# Patient Record
Sex: Male | Born: 1965 | Race: Black or African American | Hispanic: No | Marital: Single | Smoking: Current every day smoker
Health system: Southern US, Community
[De-identification: ages and names within clinical notes are randomized; demographics above are authoritative.]

## PROBLEM LIST (undated history)

## (undated) DIAGNOSIS — D539 Nutritional anemia, unspecified: Secondary | ICD-10-CM

## (undated) DIAGNOSIS — Z789 Other specified health status: Secondary | ICD-10-CM

## (undated) HISTORY — PX: TOOTH EXTRACTION: SUR596

## (undated) HISTORY — DX: Nutritional anemia, unspecified: D53.9

## (undated) HISTORY — PX: NO PAST SURGERIES: SHX2092

---

## 2010-08-17 ENCOUNTER — Emergency Department (HOSPITAL_COMMUNITY)
Admission: EM | Admit: 2010-08-17 | Discharge: 2010-08-17 | Disposition: A | Discharge status: Home / Self Care | Payer: Self-pay | Attending: Emergency Medicine | Admitting: Emergency Medicine

## 2010-08-17 DIAGNOSIS — K089 Disorder of teeth and supporting structures, unspecified: Secondary | ICD-10-CM | POA: Insufficient documentation

## 2011-09-08 ENCOUNTER — Emergency Department (INDEPENDENT_AMBULATORY_CARE_PROVIDER_SITE_OTHER)
Admission: EM | Admit: 2011-09-08 | Discharge: 2011-09-08 | Disposition: A | Discharge status: Home / Self Care | Payer: Managed Care, Other (non HMO) | Source: Home / Self Care

## 2011-09-08 ENCOUNTER — Encounter (HOSPITAL_COMMUNITY): Payer: Self-pay | Admitting: *Deleted

## 2011-09-08 DIAGNOSIS — K047 Periapical abscess without sinus: Secondary | ICD-10-CM

## 2011-09-08 MED ORDER — HYDROCODONE-ACETAMINOPHEN 5-500 MG PO TABS: 1.0000 | ORAL_TABLET | Freq: Four times a day (QID) | ORAL | Status: AC | PRN

## 2011-09-08 MED ORDER — CLINDAMYCIN HCL 300 MG PO CAPS: 300.0000 mg | ORAL_CAPSULE | Freq: Three times a day (TID) | ORAL | Status: AC

## 2011-09-08 NOTE — ED Notes (Signed)
Reports waking up with right jaw swelling and lower tooth pain.  Denies fevers.  Has not taken any measures to help alleviate sxs.  Does not have dentist.

## 2011-09-08 NOTE — Discharge Instructions (Signed)
Make sure you keep drinking and stay hydrated.   Come back if you start having any nausea or vomiting, fevers or chills. Take the antibiotic three times a day and the vicodin for pain relief up to 3 times a day.    Dental Abscess A dental abscess usually starts from an infected tooth. Antibiotic medicine and pain pills can be helpful, but dental infections require the attention of a dentist. Rinse around the infected area often with salt water (a pinch of salt in 8 oz of warm water). Do not apply heat to the outside of your face. See your dentist or oral surgeon as soon as possible.  SEEK IMMEDIATE MEDICAL CARE IF:  You have increasing, severe pain that is not relieved by medicine.   You or your child has an oral temperature above 102 F (38.9 C), not controlled by medicine.   Your baby is older than 3 months with a rectal temperature of 102 F (38.9 C) or higher.   Your baby is 62 months old or younger with a rectal temperature of 100.4 F (38 C) or higher.   You develop chills, severe headache, difficulty breathing, or trouble swallowing.   You have swelling in the neck or around the eye.  Document Released: 06/16/2005 Document Revised: 06/05/2011 Document Reviewed: 11/25/2006 [facility] Patient Information 2012 [place], [place].

## 2011-09-08 NOTE — ED Provider Notes (Signed)
Darius Flowers is a 46 y.o. male who presents to Urgent Care today for swelling of right lower jaw for past 6 hours. Patient states that he woke up this morning with some soreness to his jaw. He did not have any practice. He went to work and noticed about lunch time that he had started having swelling along lower jaw. No fevers or chills. He has not wanted to eat due to the pain. Is not taking anything for relief of pain. No nausea or vomiting.   PMH reviewed.  ROS as above otherwise neg Medications reviewed. No current facility-administered medications for this encounter.   No current outpatient prescriptions on file.    Exam:  BP 132/69  Pulse 64  Temp(Src) 98.5 F (36.9 C) (Oral)  Resp 16  SpO2 99% Gen: Well NAD HEENT: EOMI,  mucous membranes somewhat dry. Poor dental hygiene. Swelling noted along right aspect of mandible along first molar. I can't tell whether this is swelling inside of his mouth or more in the cheek wall. Lungs: CTABL Nl WOB Heart: RRR no MRG Abd: NABS, NT, ND   Assessment and Plan:  #1. Dental abscess: Plan to treat with clindamycin and Vicodin for pain relief. We have given him the affordable dentures packets and he was given instructions to followup with the dentist tomorrow. He was also given instructions and red flags that would prompt his return to [place] or the emergency room. He has no current systemic signs or symptoms of infection. I did not see an abscess that I myself could drain.  Tobey Grim, MD 09/08/11 [phone]

## 2011-09-08 NOTE — ED Provider Notes (Signed)
Medical screening examination/treatment/procedure(s) were performed by resident physician and as supervising physician I was immediately available for consultation/collaboration.  Richardo Priest, MD  Renaee Munda, MD 09/08/11 2133

## 2012-12-28 ENCOUNTER — Emergency Department (HOSPITAL_COMMUNITY): Payer: 59

## 2012-12-28 ENCOUNTER — Emergency Department (HOSPITAL_COMMUNITY)
Admission: EM | Admit: 2012-12-28 | Discharge: 2012-12-28 | Disposition: A | Discharge status: Home / Self Care | Payer: 59 | Attending: Emergency Medicine | Admitting: Emergency Medicine

## 2012-12-28 DIAGNOSIS — S82839A Other fracture of upper and lower end of unspecified fibula, initial encounter for closed fracture: Secondary | ICD-10-CM

## 2012-12-28 DIAGNOSIS — F17200 Nicotine dependence, unspecified, uncomplicated: Secondary | ICD-10-CM | POA: Insufficient documentation

## 2012-12-28 DIAGNOSIS — S8253XA Displaced fracture of medial malleolus of unspecified tibia, initial encounter for closed fracture: Secondary | ICD-10-CM | POA: Insufficient documentation

## 2012-12-28 DIAGNOSIS — X500XXA Overexertion from strenuous movement or load, initial encounter: Secondary | ICD-10-CM | POA: Insufficient documentation

## 2012-12-28 DIAGNOSIS — Y9289 Other specified places as the place of occurrence of the external cause: Secondary | ICD-10-CM | POA: Insufficient documentation

## 2012-12-28 DIAGNOSIS — S82409A Unspecified fracture of shaft of unspecified fibula, initial encounter for closed fracture: Secondary | ICD-10-CM | POA: Insufficient documentation

## 2012-12-28 DIAGNOSIS — Y9301 Activity, walking, marching and hiking: Secondary | ICD-10-CM | POA: Insufficient documentation

## 2012-12-28 DIAGNOSIS — S8252XA Displaced fracture of medial malleolus of left tibia, initial encounter for closed fracture: Secondary | ICD-10-CM

## 2012-12-28 MED ORDER — HYDROCODONE-ACETAMINOPHEN 5-325 MG PO TABS: 1.0000 | ORAL_TABLET | Freq: Four times a day (QID) | ORAL | Status: DC | PRN

## 2012-12-28 MED ORDER — HYDROCODONE-ACETAMINOPHEN 5-325 MG PO TABS
2.0000 | ORAL_TABLET | Freq: Once | ORAL | Status: AC
  Administered 2012-12-28: 2 via ORAL
  Filled 2012-12-28: qty 2

## 2012-12-28 NOTE — ED Provider Notes (Signed)
History    This chart was scribed for a non-physician practitioner working with Gilda Crease, MD by Jiles Prows, ED scribe. This patient was seen in room TR06C/TR06C and the patient's care was started at 5:18 PM.  CSN: [id number] Arrival date & time 12/28/12  1604  Chief Complaint  Patient presents with  . Ankle Pain   The history is provided by the patient and medical records. No language interpreter was used.   HPI Comments: Darius Flowers is a 47 y.o. male who presents to the Emergency Department complaining of moderate to severe worsening pain to his left lateral ankle since Sunday.  He states that on Sunday, his ankle "turned" under his foot while walking [place] loose [place] railroad tracks.  He states that he was able to bear weight and walk on it on Sunday and Monday morning, but since Monday afternoon the pain kept him from bearing weight.  Pain worse with ambulation.  He has not taken anything for pain prior to arrival.  Denies numbness or tingling.  He has noticed some swelling of the ankle.  Pt denies headache, diaphoresis, fever, chills, nausea, vomiting, diarrhea, weakness, cough, SOB and any other pain.  No past medical history on file. No past surgical history on file. No family history on file. History  Substance Use Topics  . Smoking status: Current Every Day Smoker -- 0.50 packs/day  . Smokeless tobacco: Not on file  . Alcohol Use: Yes     Comment: occasional    Review of Systems  Neurological: Negative for weakness.  All other systems reviewed and are negative.    Allergies  Review of patient's allergies indicates no known allergies.  Home Medications  No current outpatient prescriptions on file. BP 143/64  Pulse 54  Temp(Src) 99.5 F (37.5 C) (Oral)  Resp 16  SpO2 98% Physical Exam  Nursing note and vitals reviewed. Constitutional: He is oriented to person, place, and time. He appears well-developed and well-nourished. No distress.  HENT:   Head: Normocephalic and atraumatic.  Eyes: EOM are normal.  Neck: Neck supple. No tracheal deviation present.  Cardiovascular: Normal rate, regular rhythm and normal heart sounds.   +2 Dorsal pedis pulse bilaterally.    Pulmonary/Chest: Effort normal and breath sounds normal. No respiratory distress.  Musculoskeletal: Normal range of motion. He exhibits edema and tenderness.  Tenderness to palpation to lateral and medial malleolus.  Swelling to both lateral and medial malleolus.  Neurological: He is alert and oriented to person, place, and time.  Sensation intact.    Skin: Skin is warm and dry.  Psychiatric: He has a normal mood and affect. His behavior is normal.    ED Course  Procedures (including critical care time) DIAGNOSTIC STUDIES: Oxygen Saturation is 98% on RA, normal by my interpretation.    COORDINATION OF CARE: 5:22 PM - Discussed ED treatment with pt at bedside and pt agrees.   6:02 PM - Recheck.    Labs Reviewed - No data to display Dg Ankle Complete Left  12/28/2012   *RADIOLOGY REPORT*  Clinical Data: Pain post trauma  LEFT ANKLE COMPLETE - 3+ VIEW  Comparison: None.  Findings: Frontal, oblique, and lateral views were obtained.  There is a spiral type fracture of the distal fibular metaphysis with soft tissue swelling laterally.  There is a avulsion arising from the medial malleolus.  No other fractures are appreciated.  Ankle mortise appears intact.  There is no appreciable joint effusion.  IMPRESSION: Spiral fracture distal  fibula in near anatomic alignment.  A avulsion arising from the medial malleolus.  Marked swelling laterally.   Original Report Authenticated By: Bretta Bang, M.D.   No diagnosis found.  MDM  Patient presenting with left ankle pain after twisting his ankle.  Xray results as above.  Fracture is a closed fracture.  Patient neurovascularly intact.  Posterior splint applied in the ED and patient given crutches. Patient discharged home with pain  medication and Orthopedic referral.  I personally performed the services described in this documentation, which was scribed in my presence. The recorded information has been reviewed and is accurate.   Pascal Lux [place], PA-C 12/28/12 1951

## 2012-12-28 NOTE — ED Notes (Signed)
I gave the patient a large ice pack for his ankle. 

## 2012-12-28 NOTE — Progress Notes (Signed)
Orthopedic Tech Progress Note Patient Details:  Darius Flowers 1966-05-18 [id number]  Ortho Devices Type of Ortho Device: Ace wrap;Crutches;Post (short leg) splint Ortho Device/Splint Location: left leg Ortho Device/Splint Interventions: Application   Nikki Dom 12/28/2012, 6:47 PM

## 2012-12-28 NOTE — ED Notes (Signed)
Patient transported to X-ray 

## 2012-12-28 NOTE — ED Notes (Signed)
Pt C/O L ankle pain after "twisting" ankle walking on loose rocks. Initially able to walk on but has gotten worse and is unable to bear weight now.

## 2012-12-30 ENCOUNTER — Other Ambulatory Visit: Payer: Self-pay | Admitting: Orthopedic Surgery

## 2012-12-30 NOTE — ED Provider Notes (Signed)
Medical screening examination/treatment/procedure(s) were performed by non-physician practitioner and as supervising physician I was immediately available for consultation/collaboration.    Gilda Crease, MD 12/30/12 [phone]

## 2013-01-03 ENCOUNTER — Encounter (HOSPITAL_COMMUNITY): Payer: Self-pay | Admitting: Pharmacy Technician

## 2013-01-03 ENCOUNTER — Other Ambulatory Visit: Payer: Self-pay | Admitting: Orthopedic Surgery

## 2013-01-03 NOTE — H&P (Signed)
Darius Flowers is an 47 y.o. male.   Chief Complaint: left ankle pain  HPI: The patient is a 47 year old male who presents with ankle complaints. The patient reports left ankle symptoms including: pain and swelling which began 12/27/12 following a specific injury (rotational injury while walking on loose gravel, eversion type injury). The patient describes their symptoms as moderate in severity, exacerbated by weight bearing and walking. Symptoms are relieved by rest, support, immobilization and opioid analgesics. Past treatment for this problem has included application of ice, posterior splint, crutches (non-weight bearing) and opioid analgesics (Hydrocodone). He reports medial and lateral pain, some soreness in the foot as well.   No past medical history on file.  No past surgical history on file.  No family history on file. Social History:  reports that he has been smoking.  He does not have any smokeless tobacco history on file. He reports that  drinks alcohol. He reports that he does not use illicit drugs.  Allergies: No Known Allergies   (Not in a hospital admission)  No results found for this or any previous visit (from the past 48 hour(s)). No results found.  Review of Systems  Constitutional: Negative.   HENT: Negative.   Eyes: Negative.   Respiratory: Negative.   Cardiovascular: Negative.   Gastrointestinal: Negative.   Genitourinary: Negative.   Musculoskeletal: Positive for joint pain.  Skin: Negative.   Neurological: Negative.   Psychiatric/Behavioral: Negative.     There were no vitals taken for this visit. Physical Exam  Constitutional: He is oriented to person, place, and time. He appears well-developed and well-nourished.  HENT:  Head: Normocephalic and atraumatic.  Eyes: Conjunctivae and EOM are normal. Pupils are equal, round, and reactive to light.  Neck: Normal range of motion. Neck supple.  Cardiovascular: Normal rate and regular rhythm.   Respiratory:  Effort normal and breath sounds normal.  GI: Soft. Bowel sounds are normal.  Musculoskeletal:  Peripheral Vascular Lower Extremity: Palpation:Tenderness- Left- no calf tenderness to palpation. Posterior tibial pulse- Bilateral- 2+. Dorsalis pedis pulse- Bilateral- 2+.  Neurologic Sensation:Lower Extremity- Left- sensation is intact in the lower extremity, unless otherwise mentioned.  Left Ankle: Inspection and Palpation:Tenderness- lateral malleolus tender to palpation, medial malleolus tender to palpation, anterior talofibular ligament (ATFL) tender to palpation and deltoid ligament tender to palpation. no tenderness to palpation of the Achilles tendon, no tenderness to palpation of the base of the 5th metatarsal and no tenderness to palpation of the calf. Swelling- mild. Tissue tension/texture is - soft (compartments soft). Sensation is - normal. Strength and Tone:Testing limited- due to pain. NWG:NFAOZHY limited- Note: known bimalleolar ankle fx, not evaluated Left Foot: Inspection and Palpation:Tenderness- generalized tenderness about the foot. Swelling- mild. Tissue tension/texture is - soft. Sensation is - normal.  Neurological: He is alert and oriented to person, place, and time. He has normal reflexes.  Skin: Skin is warm and dry.  Psychiatric: He has a normal mood and affect.    xrays L ankle from [facility] system, images and reports reviewed today by Dr. Shelle Iron. There is a slightly displaced fx (approx 2mm) of the lateral malleolus as well as an avulsion fx medial malleolus consistent with deltoid injury. Slight mortise widening noted. No other fx seen. No subluxation, dislocation, lytic or blastic lesions.  Assessment/Plan L ankle bimalleolar fx  Dr. Shelle Iron and I discussed the situation with the pt in detail. Given the displacement of his fx and the medial deltoid injury recommend proceeding with ORIF to  decrease chance of post-traumatic arthritis and  regain function of his ankle. Discussed other options including conservative tx with casting which has higher chances of arthritic pain and instability. Discussed ORIF, procedure itself as well as risks, complications and alternatives including but not limited to DVT, PE, infx, bleeding, failure of procedure, need for secondary procedure, nerve injury, malunion, nonunion, anesthesia risk, even death. He understands and would like to proceed with ORIF. He was placed in CAM walker today for immobilization, may switch to posterior splint if more comfortable. Remain NWB, ice and elevation to reduce swelling as much as possible, crutches for ambulation. Pt aware he will likely be NWB for at least 6 weeks post-operatively and will likely require long term disability before returning to work. He was given a work note to remain out of work for further notice until disability paperwork is filled out. Also discussed smoking cessation as smoking will delay healing and increase his chance of infx. He states he has quit before and will try again prior to surgery. He will follow up 10-14 days post-op for staple removal and xrays.  Plan ORIF L ankle   BISSELL, JACLYN M. for Dr. Shelle Iron 01/03/2013, 2:31 PM

## 2013-01-04 NOTE — Patient Instructions (Addendum)
18 Darius Flowers  01/04/2013   Your procedure is scheduled on: 01/07/13  Report to [facility] at 12:50 PM.  Call this number if you have problems the morning of surgery [phone]: [phone]   Remember:   Do not eat food After Midnight, clear liquids from midnight until 9:20 on 01/07/13 then nothing.      Take these medicines the morning of surgery with A SIP OF WATER: vicodin if needed   Do not wear jewelry, make-up or nail polish.  Do not wear lotions, powders, or perfumes. You may wear deodorant.  Do not shave 48 hours prior to surgery. Men may shave face and neck.  Do not bring valuables to the hospital.  Contacts, dentures or bridgework may not be worn into surgery.  Leave suitcase in the car. After surgery it may be brought to your room.  For patients admitted to the hospital, checkout time is 11:00 AM the day of discharge.    Please read over the following fact sheets that you were given: incentive spirometry fact sheet, clear liquids fact sheet Birdie Sons, RN  pre op nurse call if needed [phone]    FAILURE TO FOLLOW THESE INSTRUCTIONS MAY RESULT IN CANCELLATION OF YOUR SURGERY   Patient Signature: ___________________________________________

## 2013-01-05 ENCOUNTER — Encounter (HOSPITAL_COMMUNITY)
Admission: RE | Admit: 2013-01-05 | Discharge: 2013-01-05 | Disposition: A | Discharge status: Home / Self Care | Payer: 59 | Source: Ambulatory Visit | Attending: Specialist | Admitting: Specialist

## 2013-01-05 ENCOUNTER — Encounter (HOSPITAL_COMMUNITY): Payer: Self-pay

## 2013-01-05 ENCOUNTER — Other Ambulatory Visit (HOSPITAL_COMMUNITY): Payer: Self-pay | Admitting: Orthopedic Surgery

## 2013-01-05 ENCOUNTER — Ambulatory Visit (HOSPITAL_COMMUNITY)
Admission: RE | Admit: 2013-01-05 | Discharge: 2013-01-05 | Disposition: A | Discharge status: Home / Self Care | Payer: 59 | Source: Ambulatory Visit | Attending: Orthopedic Surgery | Admitting: Orthopedic Surgery

## 2013-01-05 DIAGNOSIS — Z01818 Encounter for other preprocedural examination: Secondary | ICD-10-CM

## 2013-01-05 DIAGNOSIS — S82899A Other fracture of unspecified lower leg, initial encounter for closed fracture: Secondary | ICD-10-CM | POA: Insufficient documentation

## 2013-01-05 DIAGNOSIS — X58XXXA Exposure to other specified factors, initial encounter: Secondary | ICD-10-CM | POA: Insufficient documentation

## 2013-01-05 HISTORY — DX: Other specified health status: Z78.9

## 2013-01-05 LAB — CBC
Hemoglobin: 14.3 g/dL (ref 13.0–17.0)
MCH: 33.6 pg (ref 26.0–34.0)
MCHC: 34.5 g/dL (ref 30.0–36.0)

## 2013-01-06 NOTE — Progress Notes (Signed)
Pt notified of surg time change to 2:30 pm - instructed to arrive at short stay at 12:00 noon and clear liquids 12:00 am to 8:30 am then NPO

## 2013-01-07 ENCOUNTER — Encounter (HOSPITAL_COMMUNITY): Payer: Self-pay | Admitting: *Deleted

## 2013-01-07 ENCOUNTER — Ambulatory Visit (HOSPITAL_COMMUNITY): Payer: 59

## 2013-01-07 ENCOUNTER — Ambulatory Visit (HOSPITAL_COMMUNITY)
Admission: RE | Admit: 2013-01-07 | Discharge: 2013-01-08 | Disposition: A | Discharge status: Home / Self Care | Payer: 59 | Source: Ambulatory Visit | Attending: Specialist | Admitting: Specialist

## 2013-01-07 ENCOUNTER — Ambulatory Visit (HOSPITAL_COMMUNITY): Payer: 59 | Admitting: *Deleted

## 2013-01-07 ENCOUNTER — Encounter (HOSPITAL_COMMUNITY)
Admission: RE | Disposition: A | Discharge status: Home / Self Care | Payer: Self-pay | Source: Ambulatory Visit | Attending: Specialist

## 2013-01-07 DIAGNOSIS — S82892A Other fracture of left lower leg, initial encounter for closed fracture: Secondary | ICD-10-CM

## 2013-01-07 DIAGNOSIS — X500XXA Overexertion from strenuous movement or load, initial encounter: Secondary | ICD-10-CM | POA: Insufficient documentation

## 2013-01-07 DIAGNOSIS — S82843A Displaced bimalleolar fracture of unspecified lower leg, initial encounter for closed fracture: Secondary | ICD-10-CM | POA: Insufficient documentation

## 2013-01-07 HISTORY — PX: ORIF ANKLE FRACTURE: SHX5408

## 2013-01-07 LAB — CBC
MCH: 33.2 pg (ref 26.0–34.0)
MCV: 98.7 fL (ref 78.0–100.0)
Platelets: 191 10*3/uL (ref 150–400)
RBC: 3.97 MIL/uL — ABNORMAL LOW (ref 4.22–5.81)

## 2013-01-07 SURGERY — OPEN REDUCTION INTERNAL FIXATION (ORIF) ANKLE FRACTURE
Anesthesia: General | Site: Ankle | Laterality: Left | Wound class: Clean

## 2013-01-07 MED ORDER — ONDANSETRON HCL 4 MG/2ML IJ SOLN: 4.0000 mg | Freq: Four times a day (QID) | INTRAMUSCULAR | Status: DC | PRN

## 2013-01-07 MED ORDER — METHOCARBAMOL 500 MG PO TABS: 500.0000 mg | ORAL_TABLET | Freq: Four times a day (QID) | ORAL | Status: DC | PRN

## 2013-01-07 MED ORDER — MEPERIDINE HCL 50 MG/ML IJ SOLN: 6.2500 mg | INTRAMUSCULAR | Status: DC | PRN

## 2013-01-07 MED ORDER — BUPIVACAINE-EPINEPHRINE (PF) 0.5% -1:200000 IJ SOLN
INTRAMUSCULAR | Status: AC
  Filled 2013-01-07: qty 10

## 2013-01-07 MED ORDER — PROPOFOL 10 MG/ML IV BOLUS
INTRAVENOUS | Status: DC | PRN
  Administered 2013-01-07: 200 mg via INTRAVENOUS

## 2013-01-07 MED ORDER — KETOROLAC TROMETHAMINE 10 MG PO TABS: 10.0000 mg | ORAL_TABLET | Freq: Four times a day (QID) | ORAL | Status: DC | PRN

## 2013-01-07 MED ORDER — BUPIVACAINE-EPINEPHRINE PF 0.25-1:200000 % IJ SOLN
INTRAMUSCULAR | Status: AC
  Filled 2013-01-07: qty 30

## 2013-01-07 MED ORDER — METHOCARBAMOL 100 MG/ML IJ SOLN
500.0000 mg | Freq: Four times a day (QID) | INTRAVENOUS | Status: DC | PRN
  Administered 2013-01-07: 500 mg via INTRAVENOUS
  Filled 2013-01-07: qty 5

## 2013-01-07 MED ORDER — HYDROMORPHONE HCL PF 1 MG/ML IJ SOLN
INTRAMUSCULAR | Status: AC
  Filled 2013-01-07: qty 1

## 2013-01-07 MED ORDER — EPHEDRINE SULFATE 50 MG/ML IJ SOLN
INTRAMUSCULAR | Status: DC | PRN
  Administered 2013-01-07: 5 mg via INTRAVENOUS

## 2013-01-07 MED ORDER — CEFAZOLIN SODIUM-DEXTROSE 2-3 GM-% IV SOLR
2.0000 g | Freq: Four times a day (QID) | INTRAVENOUS | Status: AC
  Administered 2013-01-07 – 2013-01-08 (×3): 2 g via INTRAVENOUS
  Filled 2013-01-07 (×3): qty 50

## 2013-01-07 MED ORDER — KETOROLAC TROMETHAMINE 15 MG/ML IJ SOLN
15.0000 mg | Freq: Four times a day (QID) | INTRAMUSCULAR | Status: DC
  Administered 2013-01-07 – 2013-01-08 (×4): 15 mg via INTRAVENOUS
  Filled 2013-01-07 (×7): qty 1

## 2013-01-07 MED ORDER — FENTANYL CITRATE 0.05 MG/ML IJ SOLN
INTRAMUSCULAR | Status: DC | PRN
  Administered 2013-01-07: 50 ug via INTRAVENOUS
  Administered 2013-01-07 (×2): 100 ug via INTRAVENOUS

## 2013-01-07 MED ORDER — METOCLOPRAMIDE HCL 5 MG/ML IJ SOLN: 5.0000 mg | Freq: Three times a day (TID) | INTRAMUSCULAR | Status: DC | PRN

## 2013-01-07 MED ORDER — LACTATED RINGERS IV SOLN
INTRAVENOUS | Status: DC
  Administered 2013-01-07: 19:00:00 via INTRAVENOUS

## 2013-01-07 MED ORDER — OXYCODONE-ACETAMINOPHEN 5-325 MG PO TABS: 1.0000 | ORAL_TABLET | ORAL | Status: DC | PRN

## 2013-01-07 MED ORDER — SODIUM CHLORIDE 0.9 % IR SOLN
Status: DC | PRN
  Administered 2013-01-07: 15:00:00

## 2013-01-07 MED ORDER — LACTATED RINGERS IV SOLN: INTRAVENOUS | Status: DC

## 2013-01-07 MED ORDER — LACTATED RINGERS IV SOLN
INTRAVENOUS | Status: DC
  Administered 2013-01-07 (×2): via INTRAVENOUS

## 2013-01-07 MED ORDER — LIDOCAINE HCL 1 % IJ SOLN
INTRAMUSCULAR | Status: DC | PRN
  Administered 2013-01-07: 50 mg via INTRADERMAL

## 2013-01-07 MED ORDER — PROMETHAZINE HCL 25 MG/ML IJ SOLN: 6.2500 mg | INTRAMUSCULAR | Status: DC | PRN

## 2013-01-07 MED ORDER — HYDROMORPHONE HCL PF 1 MG/ML IJ SOLN
INTRAMUSCULAR | Status: DC | PRN
Start: 2013-01-07 — End: 2013-01-07
  Administered 2013-01-07: 1 mg via INTRAVENOUS
  Administered 2013-01-07: 0.5 mg via INTRAVENOUS

## 2013-01-07 MED ORDER — BUPIVACAINE HCL (PF) 0.25 % IJ SOLN
INTRAMUSCULAR | Status: AC
  Filled 2013-01-07: qty 30

## 2013-01-07 MED ORDER — BUPIVACAINE HCL (PF) 0.5 % IJ SOLN
INTRAMUSCULAR | Status: AC
  Filled 2013-01-07: qty 30

## 2013-01-07 MED ORDER — CEFAZOLIN SODIUM-DEXTROSE 2-3 GM-% IV SOLR
2.0000 g | INTRAVENOUS | Status: AC
  Administered 2013-01-07: 2 g via INTRAVENOUS

## 2013-01-07 MED ORDER — HYDROMORPHONE HCL PF 1 MG/ML IJ SOLN
0.2500 mg | INTRAMUSCULAR | Status: DC | PRN
  Administered 2013-01-07 (×3): 0.5 mg via INTRAVENOUS

## 2013-01-07 MED ORDER — OXYCODONE-ACETAMINOPHEN 5-325 MG PO TABS
1.0000 | ORAL_TABLET | ORAL | Status: DC | PRN
  Administered 2013-01-08 (×2): 2 via ORAL
  Filled 2013-01-07: qty 2

## 2013-01-07 MED ORDER — ONDANSETRON HCL 4 MG/2ML IJ SOLN
INTRAMUSCULAR | Status: DC | PRN
  Administered 2013-01-07: 4 mg via INTRAVENOUS

## 2013-01-07 MED ORDER — ONDANSETRON HCL 4 MG PO TABS: 4.0000 mg | ORAL_TABLET | Freq: Four times a day (QID) | ORAL | Status: DC | PRN

## 2013-01-07 MED ORDER — METOCLOPRAMIDE HCL 10 MG PO TABS: 5.0000 mg | ORAL_TABLET | Freq: Three times a day (TID) | ORAL | Status: DC | PRN

## 2013-01-07 MED ORDER — MIDAZOLAM HCL 5 MG/5ML IJ SOLN
INTRAMUSCULAR | Status: DC | PRN
  Administered 2013-01-07: 2 mg via INTRAVENOUS

## 2013-01-07 MED ORDER — ENOXAPARIN SODIUM 40 MG/0.4ML ~~LOC~~ SOLN
40.0000 mg | Freq: Every day | SUBCUTANEOUS | Status: DC
  Administered 2013-01-08: 40 mg via SUBCUTANEOUS
  Filled 2013-01-07: qty 0.4

## 2013-01-07 MED ORDER — HYDROCODONE-ACETAMINOPHEN 5-325 MG PO TABS: 1.0000 | ORAL_TABLET | ORAL | Status: DC | PRN

## 2013-01-07 MED ORDER — HYDROMORPHONE HCL PF 1 MG/ML IJ SOLN: 0.5000 mg | INTRAMUSCULAR | Status: DC | PRN

## 2013-01-07 MED ORDER — CEFAZOLIN SODIUM-DEXTROSE 2-3 GM-% IV SOLR
INTRAVENOUS | Status: AC
  Filled 2013-01-07: qty 50

## 2013-01-07 SURGICAL SUPPLY — 58 items
BAG ZIPLOCK 12X15 (MISCELLANEOUS) IMPLANT
BANDAGE ELASTIC 4 VELCRO ST LF (GAUZE/BANDAGES/DRESSINGS) ×2 IMPLANT
BANDAGE GAUZE ELAST BULKY 4 IN (GAUZE/BANDAGES/DRESSINGS) IMPLANT
BIT DRILL 2.5X2.75 QC CALB (BIT) ×2 IMPLANT
BIT DRILL 3.5X5.5 QC CALB (BIT) ×2 IMPLANT
CHLORAPREP W/TINT 26ML (MISCELLANEOUS) IMPLANT
CLOTH 2% CHLOROHEXIDINE 3PK (PERSONAL CARE ITEMS) ×2 IMPLANT
CLOTH BEACON ORANGE TIMEOUT ST (SAFETY) ×2 IMPLANT
CUFF TOURN SGL QUICK 34 (TOURNIQUET CUFF) ×1
CUFF TRNQT CYL 34X4X40X1 (TOURNIQUET CUFF) ×1 IMPLANT
DRAPE C-ARM 42X120 X-RAY (DRAPES) ×2 IMPLANT
DRAPE POUCH INSTRU U-SHP 10X18 (DRAPES) ×2 IMPLANT
DRAPE U-SHAPE 47X51 STRL (DRAPES) ×2 IMPLANT
DRSG ADAPTIC 3X8 NADH LF (GAUZE/BANDAGES/DRESSINGS) ×2 IMPLANT
DRSG EMULSION OIL 3X16 NADH (GAUZE/BANDAGES/DRESSINGS) ×2 IMPLANT
DRSG PAD ABDOMINAL 8X10 ST (GAUZE/BANDAGES/DRESSINGS) IMPLANT
DURAPREP 26ML APPLICATOR (WOUND CARE) ×4 IMPLANT
ELECT REM PT RETURN 9FT ADLT (ELECTROSURGICAL) ×2
ELECTRODE REM PT RTRN 9FT ADLT (ELECTROSURGICAL) ×1 IMPLANT
GLOVE BIOGEL PI IND STRL 7.0 (GLOVE) ×2 IMPLANT
GLOVE BIOGEL PI IND STRL 7.5 (GLOVE) ×2 IMPLANT
GLOVE BIOGEL PI IND STRL 8 (GLOVE) IMPLANT
GLOVE BIOGEL PI INDICATOR 7.0 (GLOVE) ×2
GLOVE BIOGEL PI INDICATOR 7.5 (GLOVE) ×2
GLOVE BIOGEL PI INDICATOR 8 (GLOVE)
GLOVE SURG SS PI 6.5 STRL IVOR (GLOVE) ×4 IMPLANT
GLOVE SURG SS PI 7.5 STRL IVOR (GLOVE) ×4 IMPLANT
GLOVE SURG SS PI 8.0 STRL IVOR (GLOVE) ×4 IMPLANT
GOWN PREVENTION PLUS LG XLONG (DISPOSABLE) ×4 IMPLANT
GOWN STRL REIN 2XL XLG LVL4 (GOWN DISPOSABLE) ×2 IMPLANT
GOWN STRL REIN XL XLG (GOWN DISPOSABLE) ×2 IMPLANT
KIT BASIN OR (CUSTOM PROCEDURE TRAY) ×2 IMPLANT
MANIFOLD NEPTUNE II (INSTRUMENTS) IMPLANT
NEEDLE HYPO 22GX1.5 SAFETY (NEEDLE) ×2 IMPLANT
PACK LOWER EXTREMITY WL (CUSTOM PROCEDURE TRAY) ×2 IMPLANT
PAD CAST 4YDX4 CTTN HI CHSV (CAST SUPPLIES) ×1 IMPLANT
PADDING CAST ABS 4INX4YD NS (CAST SUPPLIES) ×1
PADDING CAST ABS COTTON 4X4 ST (CAST SUPPLIES) ×1 IMPLANT
PADDING CAST COTTON 4X4 STRL (CAST SUPPLIES) ×1
PLATE ACE 100DEG 6HOLE (Plate) ×2 IMPLANT
PLATE ACE 100DEG 7HOLE (Plate) ×2 IMPLANT
POSITIONER SURGICAL ARM (MISCELLANEOUS) ×2 IMPLANT
SCREW CORTICAL 3.5MM  16MM (Screw) ×2 IMPLANT
SCREW CORTICAL 3.5MM 14MM (Screw) ×2 IMPLANT
SCREW CORTICAL 3.5MM 16MM (Screw) ×2 IMPLANT
SCREW CORTICAL 3.5MM 18MM (Screw) ×4 IMPLANT
SCREW CORTICAL 3.5MM 26MM (Screw) ×2 IMPLANT
SCREW NLOCK CANC HEX 4X16 (Screw) ×2 IMPLANT
SPLINT FIBERGLASS 4X30 (CAST SUPPLIES) ×2 IMPLANT
SPONGE GAUZE 4X4 12PLY (GAUZE/BANDAGES/DRESSINGS) ×2 IMPLANT
STAPLER VISISTAT (STAPLE) ×2 IMPLANT
SUCTION FRAZIER 12FR DISP (SUCTIONS) ×2 IMPLANT
SUT ETHILON 4 0 PS 2 18 (SUTURE) IMPLANT
SUT VIC AB 1 CT1 27 (SUTURE) ×1
SUT VIC AB 1 CT1 27XBRD ANTBC (SUTURE) ×1 IMPLANT
SUT VIC AB 2-0 CT1 27 (SUTURE) ×1
SUT VIC AB 2-0 CT1 TAPERPNT 27 (SUTURE) ×1 IMPLANT
SYR CONTROL 10ML LL (SYRINGE) IMPLANT

## 2013-01-07 NOTE — Brief Op Note (Signed)
01/07/2013  4:03 PM  PATIENT:  Darius Flowers  47 y.o. male  PRE-OPERATIVE DIAGNOSIS:  left ankle bimalleolar fracture  POST-OPERATIVE DIAGNOSIS:  left ankle bimalleolar fracture  PROCEDURE:  Procedure(s): OPEN REDUCTION INTERNAL FIXATION (ORIF) ANKLE FRACTURE (Left)  SURGEON:  Surgeon(s) and Role:    * Javier Docker, MD - Primary  PHYSICIAN ASSISTANT:   ASSISTANTS: Dawayne Cirri ANESTHESIA:   general  EBL:     BLOOD ADMINISTERED:none  DRAINS: none   LOCAL MEDICATIONS USED:  MARCAINE     SPECIMEN:  No Specimen  DISPOSITION OF SPECIMEN:  N/A  COUNTS:  YES  TOURNIQUET:    DICTATION: .Other Dictation: Dictation Number [id number]  PLAN OF CARE: Admit for overnight observation  PATIENT DISPOSITION:  PACU - hemodynamically stable.   Delay start of Pharmacological VTE agent (>24hrs) due to surgical blood loss or risk of bleeding: no

## 2013-01-07 NOTE — Interval H&P Note (Signed)
History and Physical Interval Note:  01/07/2013 4:03 PM  Darius Flowers  has presented today for surgery, with the diagnosis of left ankle bimalleolar fracture  The various methods of treatment have been discussed with the patient and family. After consideration of risks, benefits and other options for treatment, the patient has consented to  Procedure(s): OPEN REDUCTION INTERNAL FIXATION (ORIF) ANKLE FRACTURE (Left) as a surgical intervention .  The patient's history has been reviewed, patient examined, no change in status, stable for surgery.  I have reviewed the patient's chart and labs.  Questions were answered to the patient's satisfaction.     Shyna Duignan C

## 2013-01-07 NOTE — H&P (View-Only) (Signed)
Darius Flowers is an 47 y.o. male.   Chief Complaint: left ankle pain  HPI: The patient is a 47 year old male who presents with ankle complaints. The patient reports left ankle symptoms including: pain and swelling which began 12/27/12 following a specific injury (rotational injury while walking on loose gravel, eversion type injury). The patient describes their symptoms as moderate in severity, exacerbated by weight bearing and walking. Symptoms are relieved by rest, support, immobilization and opioid analgesics. Past treatment for this problem has included application of ice, posterior splint, crutches (non-weight bearing) and opioid analgesics (Hydrocodone). He reports medial and lateral pain, some soreness in the foot as well.   No past medical history on file.  No past surgical history on file.  No family history on file. Social History:  reports that he has been smoking.  He does not have any smokeless tobacco history on file. He reports that  drinks alcohol. He reports that he does not use illicit drugs.  Allergies: No Known Allergies   (Not in a hospital admission)  No results found for this or any previous visit (from the past 48 hour(s)). No results found.  Review of Systems  Constitutional: Negative.   HENT: Negative.   Eyes: Negative.   Respiratory: Negative.   Cardiovascular: Negative.   Gastrointestinal: Negative.   Genitourinary: Negative.   Musculoskeletal: Positive for joint pain.  Skin: Negative.   Neurological: Negative.   Psychiatric/Behavioral: Negative.     There were no vitals taken for this visit. Physical Exam  Constitutional: He is oriented to person, place, and time. He appears well-developed and well-nourished.  HENT:  Head: Normocephalic and atraumatic.  Eyes: Conjunctivae and EOM are normal. Pupils are equal, round, and reactive to light.  Neck: Normal range of motion. Neck supple.  Cardiovascular: Normal rate and regular rhythm.   Respiratory:  Effort normal and breath sounds normal.  GI: Soft. Bowel sounds are normal.  Musculoskeletal:  Peripheral Vascular Lower Extremity: Palpation:Tenderness- Left- no calf tenderness to palpation. Posterior tibial pulse- Bilateral- 2+. Dorsalis pedis pulse- Bilateral- 2+.  Neurologic Sensation:Lower Extremity- Left- sensation is intact in the lower extremity, unless otherwise mentioned.  Left Ankle: Inspection and Palpation:Tenderness- lateral malleolus tender to palpation, medial malleolus tender to palpation, anterior talofibular ligament (ATFL) tender to palpation and deltoid ligament tender to palpation. no tenderness to palpation of the Achilles tendon, no tenderness to palpation of the base of the 5th metatarsal and no tenderness to palpation of the calf. Swelling- mild. Tissue tension/texture is - soft (compartments soft). Sensation is - normal. Strength and Tone:Testing limited- due to pain. ROM:Testing limited- Note: known bimalleolar ankle fx, not evaluated Left Foot: Inspection and Palpation:Tenderness- generalized tenderness about the foot. Swelling- mild. Tissue tension/texture is - soft. Sensation is - normal.  Neurological: He is alert and oriented to person, place, and time. He has normal reflexes.  Skin: Skin is warm and dry.  Psychiatric: He has a normal mood and affect.    xrays L ankle from [place] system, images and reports reviewed today by Dr. Beane. There is a slightly displaced fx (approx 2mm) of the lateral malleolus as well as an avulsion fx medial malleolus consistent with deltoid injury. Slight mortise widening noted. No other fx seen. No subluxation, dislocation, lytic or blastic lesions.  Assessment/Plan L ankle bimalleolar fx  Dr. Beane and I discussed the situation with the pt in detail. Given the displacement of his fx and the medial deltoid injury recommend proceeding with ORIF to   decrease chance of post-traumatic arthritis and  regain function of his ankle. Discussed other options including conservative tx with casting which has higher chances of arthritic pain and instability. Discussed ORIF, procedure itself as well as risks, complications and alternatives including but not limited to DVT, PE, infx, bleeding, failure of procedure, need for secondary procedure, nerve injury, malunion, nonunion, anesthesia risk, even death. He understands and would like to proceed with ORIF. He was placed in CAM walker today for immobilization, may switch to posterior splint if more comfortable. Remain NWB, ice and elevation to reduce swelling as much as possible, crutches for ambulation. Pt aware he will likely be NWB for at least 6 weeks post-operatively and will likely require long term disability before returning to work. He was given a work note to remain out of work for further notice until disability paperwork is filled out. Also discussed smoking cessation as smoking will delay healing and increase his chance of infx. He states he has quit before and will try again prior to surgery. He will follow up 10-14 days post-op for staple removal and xrays.  Plan ORIF L ankle   Jamilla Galli M. for Dr. Beane 01/03/2013, 2:31 PM    

## 2013-01-07 NOTE — Anesthesia Preprocedure Evaluation (Signed)
Anesthesia Evaluation  Patient identified by MRN, date of birth, ID band Patient awake    Reviewed: Allergy & Precautions, H&P , NPO status , Patient's Chart, lab work & pertinent test results  Airway Mallampati: II TM Distance: >3 FB Neck ROM: Full    Dental no notable dental hx. (+) Poor Dentition   Pulmonary neg pulmonary ROS, Current Smoker,  breath sounds clear to auscultation  Pulmonary exam normal       Cardiovascular negative cardio ROS  Rhythm:Regular Rate:Normal     Neuro/Psych negative neurological ROS  negative psych ROS   GI/Hepatic negative GI ROS, Neg liver ROS,   Endo/Other  negative endocrine ROS  Renal/GU negative Renal ROS  negative genitourinary   Musculoskeletal negative musculoskeletal ROS (+)   Abdominal   Peds negative pediatric ROS (+)  Hematology negative hematology ROS (+)   Anesthesia Other Findings   Reproductive/Obstetrics negative OB ROS                           Anesthesia Physical Anesthesia Plan  ASA: II  Anesthesia Plan: General   Post-op Pain Management:    Induction: Intravenous  Airway Management Planned: LMA  Additional Equipment:   Intra-op Plan:   Post-operative Plan: Extubation in OR  Informed Consent: I have reviewed the patients History and Physical, chart, labs and discussed the procedure including the risks, benefits and alternatives for the proposed anesthesia with the patient or authorized representative who has indicated his/her understanding and acceptance.   Dental advisory given  Plan Discussed with: CRNA  Anesthesia Plan Comments:         Anesthesia Quick Evaluation

## 2013-01-07 NOTE — Transfer of Care (Signed)
Immediate Anesthesia Transfer of Care Note  Patient: Darius Flowers  Procedure(s) Performed: Procedure(s): OPEN REDUCTION INTERNAL FIXATION (ORIF) ANKLE FRACTURE (Left)  Patient Location: PACU  Anesthesia Type:General  Level of Consciousness: awake, alert  and oriented  Airway & Oxygen Therapy: Patient Spontanous Breathing and Patient connected to face mask oxygen  Post-op Assessment: Report given to PACU RN and Post -op Vital signs reviewed and stable  Post vital signs: Reviewed and stable  Complications: No apparent anesthesia complications

## 2013-01-07 NOTE — Anesthesia Postprocedure Evaluation (Signed)
  Anesthesia Post-op Note  Patient: Darius Flowers  Procedure(s) Performed: Procedure(s) (LRB): OPEN REDUCTION INTERNAL FIXATION (ORIF) ANKLE FRACTURE (Left)  Patient Location: PACU  Anesthesia Type: General  Level of Consciousness: awake and alert   Airway and Oxygen Therapy: Patient Spontanous Breathing  Post-op Pain: mild  Post-op Assessment: Post-op Vital signs reviewed, Patient's Cardiovascular Status Stable, Respiratory Function Stable, Patent Airway and No signs of Nausea or vomiting  Last Vitals:  Filed Vitals:   01/07/13 1615  BP: 167/77  Pulse: 68  Temp:   Resp: 12    Post-op Vital Signs: stable   Complications: No apparent anesthesia complications

## 2013-01-08 NOTE — Care Management Note (Addendum)
CARE MANAGEMENT NOTE 01/08/2013  Patient:  Darius Flowers, Darius Flowers   Account Number:  [id number]  Date Initiated:  01/08/2013  Documentation initiated by:  Infiniti Hoefling  Subjective/Objective Assessment:   47 yo male admitted s/p OPEN REDUCTION INTERNAL FIXATION (ORIF) ANKLE FRACTURE (Left).     Action/Plan:   Home when stable   Anticipated DC Date:  01/08/2013   Anticipated DC Plan:  HOME/SELF CARE  In-house referral  NA      DC Planning Services  NA      PAC Choice  NA   Choice offered to / List presented to:  NA   DME arranged  NA      DME agency  NA     HH arranged  NA      HH agency  NA   Status of service:  Completed, signed off Medicare Important Message given?   (If response is "NO", the following Medicare IM given date fields will be blank) Date Medicare IM given:   Date Additional Medicare IM given:    Discharge Disposition:    Per UR Regulation:    If discussed at Long Length of Stay Meetings, dates discussed:    Comments:  01/08/13 0932 Leonie Green [phone] chart reviewed for discharge. No MD orders for [facility] services. Pt uses provider within network, unsure of name. No needs identified.

## 2013-01-08 NOTE — Progress Notes (Signed)
Darius Flowers  MRN: [id number] DOB/Age: 49-Jan-1967 49 y.o. Physician: Jacquelyne Balint Procedure: Procedure(s) (LRB): OPEN REDUCTION INTERNAL FIXATION (ORIF) ANKLE FRACTURE (Left)     Subjective: Eating breakfast. Voiding well. Pain controlled  Vital Signs Temp:  [97.3 F (36.3 C)-98.6 F (37 C)] 98 F (36.7 C) (07/12 0629) Pulse Rate:  [50-72] 50 (07/12 0629) Resp:  [8-18] 16 (07/12 0629) BP: (102-167)/(56-84) 102/60 mmHg (07/12 0629) SpO2:  [96 %-100 %] 100 % (07/12 0629) Weight:  [63.504 kg (140 lb)] 63.504 kg (140 lb) (07/11 2005)  Lab Results  Recent Labs  01/05/13 1430 01/07/13 1733  WBC 5.3 6.6  HGB 14.3 13.2  HCT 41.4 39.2  PLT 160 191   BMET  Recent Labs  01/07/13 1733  CREATININE 0.88   No results found for this basename: inr     Exam Cast to left LE clean and dry Moves toes well        Plan DC home today FU 2 weeks  Claudia Alvizo for Dr.Kevin Supple 01/08/2013, 8:19 AM 01/08/2013, 8:19 AM

## 2013-01-08 NOTE — Op Note (Signed)
Darius Flowers, Darius Flowers                ACCOUNT NO.:  [id number]  MEDICAL RECORD NO.:  [id number]  LOCATION:  1605                         FACILITY:  [facility]  PHYSICIAN:  Jene Every, M.D.    DATE OF BIRTH:  01-06-1966  DATE OF PROCEDURE:  01/07/2013 DATE OF DISCHARGE:                              OPERATIVE REPORT   PREOPERATIVE DIAGNOSIS:  Bimalleolar ankle fracture.  POSTOPERATIVE DIAGNOSIS:  Bimalleolar ankle fracture.  PROCEDURE PERFORMED:  Open reduction and internal fixation of bimalleolar ankle fracture.  ANESTHESIA:  General.  ASSISTANT:  Lanna Poche, PA  HISTORY:  A 47 year old, fracture of the fibula and had avulsion fracture of the medial malleolus, indicated for open reduction and internal fixation predominantly of the fibula, possibly of the medial malleolus and deltoid.  Risk and benefits were discussed including bleeding, infection, damage to neurovascular structure, DVT, PE, anesthetic complications, etc.  TECHNIQUE:  With the patient in supine position after induction of adequate general anesthesia, 2 g of Kefzol, left lower extremity was prepped and draped in usual sterile fashion.  A lateral incision was made over the fibula.  Subcutaneous tissue was dissected. Electrocautery was utilized to achieve hemostasis.  Divided the tissue, preserved the neurovascular structures, protected the branches of the superficial peroneal nerve.  Identified the fracture site, which was distal and oblique.  It was reduced with a reduction maneuver, held with a tenaculum.  I placed an interfragmentary screw anterior to posterior screw, we were drilling approximately 3.5 using the 2.5 measuring appropriate length screw and inserting the bicortical screw with excellent purchase expressing fracture hematoma.  Then, I contoured a third tubular plate over the fibula for neutralization.  Fibula had a rotation proximally such that the plate would be required to be placed more  anteriorly or posteriorly.  I selected the anterior portion to augment soft tissue coverage.  This was then contoured distally.  The shape of the distal fibula was unique as well and I was able to lay the plate flat with contouring slightly proximal from its tip.  I fixed it proximally with fully-threaded cortical screws after drilling depth gauge measurement, insertion of the screw and distally with two screws; one cancellous and one cortical, cancellous distal with good purchase and had excellent fixation.  Wound was copiously irrigated.  Stress radiographs revealed no widening of the mortise.  The fibula was too length.  The mortise was restored.  There was no widening of the space. Medially, the syndesmosis was intact.  Therefore, I elected not to open the medial joint line.  I felt this was stable now.  Copiously irrigated the wound.  Closed the subcutaneous with 2-0 Vicryl.  Again, care was taken not to injure the neurovascular structures, skin staples.  Wound was dressed sterilely, placed in a short-leg cast, well molded and padded with cold water and fiberglass.  He was then extubated without difficulty and transported to the recovery room in satisfactory condition.  The patient tolerated the procedure well.  No complications.  Assistant, Lanna Poche, [place].  Minimal blood loss.  No tourniquet.  He was placed under procedure performed, stress radiographs under anesthesia as well.     Jene Every, M.D.  JB/MEDQ  D:  01/07/2013  T:  01/08/2013  Job:  [id number]

## 2013-01-08 NOTE — Progress Notes (Signed)
Discharged from floor via w/c, family with pt. No changes in assessment. Ruth Tully  

## 2013-01-10 ENCOUNTER — Encounter (HOSPITAL_COMMUNITY): Payer: Self-pay | Admitting: Specialist

## 2013-03-08 ENCOUNTER — Ambulatory Visit: Payer: 59 | Attending: Orthopedic Surgery | Admitting: Physical Therapy

## 2013-03-08 DIAGNOSIS — M25676 Stiffness of unspecified foot, not elsewhere classified: Secondary | ICD-10-CM | POA: Insufficient documentation

## 2013-03-08 DIAGNOSIS — IMO0001 Reserved for inherently not codable concepts without codable children: Secondary | ICD-10-CM | POA: Insufficient documentation

## 2013-03-08 DIAGNOSIS — R269 Unspecified abnormalities of gait and mobility: Secondary | ICD-10-CM | POA: Insufficient documentation

## 2013-03-08 DIAGNOSIS — R609 Edema, unspecified: Secondary | ICD-10-CM | POA: Insufficient documentation

## 2013-03-08 DIAGNOSIS — M25673 Stiffness of unspecified ankle, not elsewhere classified: Secondary | ICD-10-CM | POA: Insufficient documentation

## 2013-03-08 DIAGNOSIS — M25579 Pain in unspecified ankle and joints of unspecified foot: Secondary | ICD-10-CM | POA: Insufficient documentation

## 2013-03-15 ENCOUNTER — Ambulatory Visit: Payer: 59 | Admitting: Physical Therapy

## 2013-03-17 ENCOUNTER — Ambulatory Visit: Payer: 59 | Admitting: Physical Therapy

## 2013-03-21 ENCOUNTER — Ambulatory Visit: Payer: 59 | Admitting: Physical Therapy

## 2013-03-24 ENCOUNTER — Encounter: Payer: Managed Care, Other (non HMO) | Admitting: Physical Therapy

## 2013-06-23 ENCOUNTER — Emergency Department (HOSPITAL_COMMUNITY): Payer: 59

## 2013-06-23 ENCOUNTER — Emergency Department (HOSPITAL_COMMUNITY)
Admission: EM | Admit: 2013-06-23 | Discharge: 2013-06-23 | Disposition: A | Discharge status: Home / Self Care | Payer: 59 | Attending: Emergency Medicine | Admitting: Emergency Medicine

## 2013-06-23 ENCOUNTER — Encounter (HOSPITAL_COMMUNITY): Payer: Self-pay | Admitting: Emergency Medicine

## 2013-06-23 DIAGNOSIS — Y939 Activity, unspecified: Secondary | ICD-10-CM | POA: Insufficient documentation

## 2013-06-23 DIAGNOSIS — S12600A Unspecified displaced fracture of seventh cervical vertebra, initial encounter for closed fracture: Secondary | ICD-10-CM

## 2013-06-23 DIAGNOSIS — F17200 Nicotine dependence, unspecified, uncomplicated: Secondary | ICD-10-CM | POA: Insufficient documentation

## 2013-06-23 DIAGNOSIS — Y92410 Unspecified street and highway as the place of occurrence of the external cause: Secondary | ICD-10-CM | POA: Insufficient documentation

## 2013-06-23 DIAGNOSIS — M25519 Pain in unspecified shoulder: Secondary | ICD-10-CM | POA: Insufficient documentation

## 2013-06-23 MED ORDER — METHOCARBAMOL 500 MG PO TABS: 500.0000 mg | ORAL_TABLET | Freq: Two times a day (BID) | ORAL | Status: DC

## 2013-06-23 MED ORDER — HYDROMORPHONE HCL PF 1 MG/ML IJ SOLN
2.0000 mg | Freq: Once | INTRAMUSCULAR | Status: AC
  Administered 2013-06-23: 2 mg via INTRAMUSCULAR
  Filled 2013-06-23: qty 2

## 2013-06-23 MED ORDER — IBUPROFEN 400 MG PO TABS: 400.0000 mg | ORAL_TABLET | Freq: Four times a day (QID) | ORAL | Status: DC | PRN

## 2013-06-23 MED ORDER — HYDROCODONE-ACETAMINOPHEN 5-325 MG PO TABS: 1.0000 | ORAL_TABLET | Freq: Four times a day (QID) | ORAL | Status: DC | PRN

## 2013-06-23 NOTE — ED Notes (Signed)
Aspen collar applied. Pt tolerated without difficulty. Vital signs stable. Pt remains alert and oriented x4. 5/10 neck pain at the time.
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
## 2013-06-23 NOTE — ED Notes (Signed)
Pt discharged home. Encouraged to follow up with Dr. Jeral Fruit next week. Also instructed to wear c-collar at all times until visiting ortho.

## 2013-07-02 ENCOUNTER — Encounter (HOSPITAL_COMMUNITY): Payer: Self-pay | Admitting: Emergency Medicine

## 2013-07-02 ENCOUNTER — Emergency Department (HOSPITAL_COMMUNITY)
Admission: EM | Admit: 2013-07-02 | Discharge: 2013-07-02 | Disposition: A | Discharge status: Home / Self Care | Payer: 59 | Attending: Emergency Medicine | Admitting: Emergency Medicine

## 2013-07-02 DIAGNOSIS — Z79899 Other long term (current) drug therapy: Secondary | ICD-10-CM | POA: Insufficient documentation

## 2013-07-02 DIAGNOSIS — Z09 Encounter for follow-up examination after completed treatment for conditions other than malignant neoplasm: Secondary | ICD-10-CM

## 2013-07-02 DIAGNOSIS — Z0289 Encounter for other administrative examinations: Secondary | ICD-10-CM | POA: Insufficient documentation

## 2013-07-02 DIAGNOSIS — F17200 Nicotine dependence, unspecified, uncomplicated: Secondary | ICD-10-CM | POA: Insufficient documentation
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
## 2013-07-02 NOTE — ED Notes (Signed)
Pt here for a note to return to work. Pt denies any pain at this time. Pt states he was unable to get an appt with neurosurgeon.
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
## 2013-07-02 NOTE — Discharge Instructions (Signed)
Please follow up with neurosurgery as discussed from initial ER encounter.  You may also try to be seen by a [facility] provider [facility] for follow up regarding neck fracture,

## 2013-07-02 NOTE — ED Notes (Signed)
Discharge instructions reviewed. Pt verbalized understanding.  

## 2013-07-02 NOTE — ED Provider Notes (Signed)
Medical screening examination/treatment/procedure(s) were performed by non-physician practitioner and as supervising physician I was immediately available for consultation/collaboration.  EKG Interpretation   None         William Laiden Milles, MD 07/02/13 1617 

## 2013-07-19 ENCOUNTER — Other Ambulatory Visit: Payer: Self-pay | Admitting: Neurosurgery

## 2013-07-19 DIAGNOSIS — IMO0002 Reserved for concepts with insufficient information to code with codable children: Secondary | ICD-10-CM

## 2013-07-22 ENCOUNTER — Ambulatory Visit
Admission: RE | Admit: 2013-07-22 | Discharge: 2013-07-22 | Disposition: A | Discharge status: Home / Self Care | Payer: 59 | Source: Ambulatory Visit | Attending: Neurosurgery | Admitting: Neurosurgery

## 2013-07-22 DIAGNOSIS — IMO0002 Reserved for concepts with insufficient information to code with codable children: Secondary | ICD-10-CM

## 2013-08-17 ENCOUNTER — Other Ambulatory Visit: Payer: Self-pay | Admitting: Neurosurgery

## 2013-08-17 DIAGNOSIS — IMO0002 Reserved for concepts with insufficient information to code with codable children: Secondary | ICD-10-CM

## 2013-08-18 ENCOUNTER — Ambulatory Visit
Admission: RE | Admit: 2013-08-18 | Discharge: 2013-08-18 | Disposition: A | Discharge status: Home / Self Care | Payer: 59 | Source: Ambulatory Visit | Attending: Neurosurgery | Admitting: Neurosurgery

## 2013-08-18 ENCOUNTER — Other Ambulatory Visit: Payer: 59

## 2013-08-18 ENCOUNTER — Other Ambulatory Visit: Payer: Self-pay | Admitting: Neurosurgery

## 2013-08-18 DIAGNOSIS — IMO0002 Reserved for concepts with insufficient information to code with codable children: Secondary | ICD-10-CM

## 2014-01-24 ENCOUNTER — Ambulatory Visit (INDEPENDENT_AMBULATORY_CARE_PROVIDER_SITE_OTHER): Payer: 59 | Admitting: Internal Medicine

## 2014-01-24 ENCOUNTER — Encounter: Payer: Self-pay | Admitting: Internal Medicine

## 2014-01-24 VITALS — BP 130/80 | HR 62 | Temp 98.1°F | Ht 68.0 in | Wt 147.8 lb

## 2014-01-24 DIAGNOSIS — Z720 Tobacco use: Secondary | ICD-10-CM | POA: Insufficient documentation

## 2014-01-24 DIAGNOSIS — Z0000 Encounter for general adult medical examination without abnormal findings: Secondary | ICD-10-CM | POA: Insufficient documentation

## 2014-01-24 DIAGNOSIS — Z833 Family history of diabetes mellitus: Secondary | ICD-10-CM | POA: Insufficient documentation

## 2014-01-24 DIAGNOSIS — F17200 Nicotine dependence, unspecified, uncomplicated: Secondary | ICD-10-CM

## 2014-01-24 DIAGNOSIS — M25569 Pain in unspecified knee: Secondary | ICD-10-CM | POA: Insufficient documentation

## 2014-01-24 NOTE — Progress Notes (Signed)
Patient ID: Darius Flowers, male   DOB: 08/04/1965, 48 y.o.   MRN: [id number]    Chief Complaint  Patient presents with  . Establish Care    NP-Establish Care, depression/fall screening done  . Immunizations   No Known Allergies  HPI 48 y/o male pt is here to establish care and get a physical. He was getting [place] [facility] [place] He as last there a year back. He has not established routine care before this and is here to establish care. He continues to smoke half a pack a day for past 25 years and is not willing to quit Never had colonoscopy in past No new concern this visit Has been active and exercises on routine basis. Tries to eat healthy  Review of Systems  Constitutional: Negative for fever, chills, weight loss, malaise/fatigue and diaphoresis.  HENT: Negative for hearing loss and sore throat.  has runny nose, watery eyes with sneezing mainly in summer season when weather is warm/ hot. This is mainly early in morning and symptoms resolve as day progresses. Has not required any medications for now. Eyes: Negative for blurred vision, double vision and discharge.  Respiratory: Negative for cough, sputum production, shortness of breath and wheezing.   Cardiovascular: Negative for chest pain, palpitations, orthopnea and leg swelling.  Gastrointestinal: Negative for heartburn, nausea, vomiting, abdominal pain, diarrhea, melena, rectal bleed and constipation.  Genitourinary: Negative for dysuria, urgency, frequency, nocturia and flank pain.  Musculoskeletal: Negative for back pain, falls, joint pain and myalgias.  Skin: Negative for itching and rash.  Neurological: Negative for dizziness, tingling, focal weakness and headaches.  Psychiatric/Behavioral: Negative for depression and memory loss. The patient is not nervous/anxious.     There is no immunization history on file for this patient.  Past Medical History  Diagnosis Date  . Medical  history non-contributory    Past Surgical History  Procedure Laterality Date  . Tooth extraction    . No past surgeries    . Orif ankle fracture Left 01/07/2013    Procedure: OPEN REDUCTION INTERNAL FIXATION (ORIF) ANKLE FRACTURE;  Surgeon: Javier DockerJeffrey C Beane, MD;  Location: [facility];  Service: Orthopedics;  Laterality: Left;   Family History  Problem Relation Age of Onset  . Diabetes Mother   . Heart disease Mother     Visual merchandiserpace maker  . Cancer Father [id number]    brain cancer   History   Social History  . Marital Status: Single    Spouse Name: N/A    Number of Children: N/A  . Years of Education: N/A   Occupational History  . Not on file.   Social History Main Topics  . Smoking status: Current Every Day Smoker -- 0.50 packs/day for 25 years    Types: Cigarettes  . Smokeless tobacco: Never Used  . Alcohol Use: 1.8 - 2.4 oz/week    3-4 Cans of beer per week     Comment: occasional  . Drug Use: No  . Sexual Activity: Not on file   Other Topics Concern  . Not on file   Social History Narrative  . No narrative on file   Physical exam BP 130/80  Pulse 62  Temp(Src) 98.1 F (36.7 C) (Oral)  Ht 5\' 8"  (1.727 m)  Wt 147 lb 12.8 oz (67.042 kg)  BMI 22.48 kg/m2  SpO2 99%  General- adult male in no acute distress Head- atraumatic, normocephalic Eyes- PERRLA, EOMI, no pallor, no icterus, no  discharge Ears- left ear normal tympanic membrane and normal external ear canal , right ear normal tympanic membrane and normal external ear canal Neck- no lymphadenopathy, no thyromegaly, no jugular vein distension, no carotid bruit Nose- normal nasaal mucosa, no maxillary sinus tenderness, no frontal sinus tenderness Mouth- normal mucus membrane, no oral thrush, normal oropharynx Chest- no chest wall deformities, no chest wall tenderness Breast- no masses, no palpable lumps, normal nipple and areola exam, no axillary lymphadenopathy Cardiovascular- normal s1,s2, no murmurs/ rubs/ gallops, normal  distal pulses Respiratory- bilateral clear to auscultation, no wheeze, no rhonchi, no crackles Abdomen- bowel sounds present, soft, non tender, no organomegaly, no abdominal bruits, no guarding or rigidity, no CVA tenderness Rectal exam and genitalia exam- refused Musculoskeletal- able to move all 4 extremities, no spinal and paraspinal tenderness, steady gait, no use of assistive device, normal range of motion, no leg edema Neurological- no focal deficit, normal reflexes, normal muscle strength, normal sensation to fine touch and vibration Skin- warm and dry Psychiatry- alert and oriented to person, place and time, normal mood and affect  Labs- None  Assessment/plan  1. Pain in joint, lower leg, unspecified laterality Takes prn tylenol. Check vit d level - Vitamin D, 1,25-dihydroxy  2. Routine general medical examination at a health care facility Will get routine labs.does not want tdap at present. the patient was counseled regarding the appropriate use of alcohol, prevention of dental and periodontal disease, diet, regular sustained exercise for at least 30 minutes 5 times per week, the proper use of sunscreen and protective clothing, tobacco use, and recommended schedule for GI hemoccult testing, colonoscopy, cholesterol, thyroid and diabetes screening. FOBT card provided. Negative for fall and depression screening. Flu vaccine during flu season. - CBC with Differential - CMP - Lipid Panel - Hemoglobin A1c  3. Family history of diabetes mellitus - Hemoglobin A1c  4. Tobacco user counselled on smoking cessation, pt not willing at present. Breathing stable. Good pulses on exam. No claudication. Monitor clinically

## 2014-01-25 LAB — CBC WITH DIFFERENTIAL/PLATELET
BASOS: 1 %
Basophils Absolute: 0.1 10*3/uL (ref 0.0–0.2)
EOS ABS: 0.1 10*3/uL (ref 0.0–0.4)
EOS: 2 %
HCT: 41 % (ref 37.5–51.0)
HEMOGLOBIN: 14 g/dL (ref 12.6–17.7)
Immature Grans (Abs): 0 10*3/uL (ref 0.0–0.1)
Immature Granulocytes: 0 %
LYMPHS: 25 %
Lymphocytes Absolute: 1.3 10*3/uL (ref 0.7–3.1)
MCH: 33.8 pg — AB (ref 26.6–33.0)
MCHC: 34.1 g/dL (ref 31.5–35.7)
MCV: 99 fL — AB (ref 79–97)
MONOS ABS: 0.5 10*3/uL (ref 0.1–0.9)
Monocytes: 9 %
NEUTROS ABS: 3.2 10*3/uL (ref 1.4–7.0)
Neutrophils Relative %: 63 %
RBC: 4.14 x10E6/uL (ref 4.14–5.80)
RDW: 13.2 % (ref 12.3–15.4)
WBC: 5 10*3/uL (ref 3.4–10.8)

## 2014-01-25 LAB — COMPREHENSIVE METABOLIC PANEL
A/G RATIO: 1.8 (ref 1.1–2.5)
ALT: 37 IU/L (ref 0–44)
AST: 40 IU/L (ref 0–40)
Albumin: 4.5 g/dL (ref 3.5–5.5)
Alkaline Phosphatase: 75 IU/L (ref 39–117)
BILIRUBIN TOTAL: 0.8 mg/dL (ref 0.0–1.2)
BUN/Creatinine Ratio: 11 (ref 9–20)
BUN: 12 mg/dL (ref 6–24)
CALCIUM: 9.6 mg/dL (ref 8.7–10.2)
CO2: 23 mmol/L (ref 18–29)
CREATININE: 1.05 mg/dL (ref 0.76–1.27)
Chloride: 101 mmol/L (ref 97–108)
GFR, EST AFRICAN AMERICAN: 95 mL/min/{1.73_m2} (ref >59)
GFR, EST NON AFRICAN AMERICAN: 82 mL/min/{1.73_m2} (ref >59)
GLOBULIN, TOTAL: 2.5 g/dL (ref 1.5–4.5)
GLUCOSE: 76 mg/dL (ref 65–99)
POTASSIUM: 4.3 mmol/L (ref 3.5–5.2)
Sodium: 139 mmol/L (ref 134–144)
TOTAL PROTEIN: 7 g/dL (ref 6.0–8.5)

## 2014-01-25 LAB — LIPID PANEL
CHOL/HDL RATIO: 1.8 ratio (ref 0.0–5.0)
Cholesterol, Total: 181 mg/dL (ref 100–199)
HDL: 99 mg/dL (ref >39)
LDL CALC: 70 mg/dL (ref 0–99)
Triglycerides: 62 mg/dL (ref 0–149)
VLDL CHOLESTEROL CAL: 12 mg/dL (ref 5–40)

## 2014-01-25 LAB — VITAMIN D 1,25 DIHYDROXY: Vit D, 1,25-Dihydroxy: 34.9 pg/mL (ref 19.9–79.3)

## 2014-01-25 LAB — HEMOGLOBIN A1C
ESTIMATED AVERAGE GLUCOSE: 111 mg/dL
HEMOGLOBIN A1C: 5.5 % (ref 4.8–5.6)

## 2014-01-26 ENCOUNTER — Encounter: Payer: Self-pay | Admitting: *Deleted

## 2014-01-26 LAB — SPECIMEN STATUS REPORT

## 2014-01-27 LAB — B12 AND FOLATE PANEL
Folate: 10.3 ng/mL (ref >3.0)
Vitamin B-12: 415 pg/mL (ref 211–946)

## 2014-01-27 LAB — SPECIMEN STATUS REPORT
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
## 2016-02-29 ENCOUNTER — Ambulatory Visit (HOSPITAL_COMMUNITY)
Admission: EM | Admit: 2016-02-29 | Discharge: 2016-02-29 | Disposition: A | Discharge status: Home / Self Care | Payer: 59 | Attending: Internal Medicine | Admitting: Internal Medicine

## 2016-02-29 ENCOUNTER — Encounter (HOSPITAL_COMMUNITY): Payer: Self-pay | Admitting: *Deleted

## 2016-02-29 DIAGNOSIS — K0889 Other specified disorders of teeth and supporting structures: Secondary | ICD-10-CM

## 2016-02-29 MED ORDER — AMOXICILLIN-POT CLAVULANATE 875-125 MG PO TABS
1.0000 | ORAL_TABLET | Freq: Two times a day (BID) | ORAL | 0 refills | Status: AC
Start: 2016-02-29 — End: ?

## 2016-02-29 NOTE — ED Provider Notes (Signed)
[facility]    CSN: [id number] Arrival date & time: 02/29/16  1036  First Provider Contact:  First MD Initiated Contact with Patient 02/29/16 1235        History   Chief Complaint Chief Complaint  Patient presents with  . Dental Pain    HPI Darius Flowers is a 50 y.o. male. He presents today with the onset of pain in the L upper back molar and gum.  Some spasm in the L cheek.  Hurts to chew.  Awake last night with pain, a [employer] powder this morning was helpful.  No fever, no malaise, no neck swelling.  HPI  Past Medical History:  Diagnosis Date  . Anemia, macrocytic   . Medical history non-contributory     Patient Active Problem List   Diagnosis Date Noted  . Pain in joint, lower leg 01/24/2014  . Routine general medical examination at a health care facility 01/24/2014  . Family history of diabetes mellitus 01/24/2014  . Tobacco user 01/24/2014  . Ankle fracture, left 01/07/2013    Past Surgical History:  Procedure Laterality Date  . NO PAST SURGERIES    . ORIF ANKLE FRACTURE Left 01/07/2013   Procedure: OPEN REDUCTION INTERNAL FIXATION (ORIF) ANKLE FRACTURE;  Surgeon: Javier Docker, MD;  Location: [facility];  Service: Orthopedics;  Laterality: Left;  . TOOTH EXTRACTION        Home Medications    Prior to Admission medications   Medication Sig Start Date End Date Taking? Authorizing Provider  amoxicillin-clavulanate (AUGMENTIN) 875-125 MG tablet Take 1 tablet by mouth every 12 (twelve) hours. 02/29/16   Eustace Moore, MD    Family History Family History  Problem Relation Age of Onset  . Diabetes Mother   . Heart disease Mother     Visual merchandiser  . Cancer Father 59    brain cancer    Social History Social History  Substance Use Topics  . Smoking status: Current Every Day Smoker    Packs/day: 0.50    Years: 25.00    Types: Cigarettes  . Smokeless tobacco: Never Used  . Alcohol use 1.8 - 2.4 oz/week    3 - 4 Cans of beer per week     Comment:  occasional     Allergies   Review of patient's allergies indicates no known allergies.   Review of Systems Review of Systems  All other systems reviewed and are negative.    Physical Exam Triage Vital Signs ED Triage Vitals  Enc Vitals Group     BP 02/29/16 1145 129/69     Pulse Rate 02/29/16 1145 60     Resp 02/29/16 1145 16     Temp 02/29/16 1145 98.5 F (36.9 C)     Temp Source 02/29/16 1145 Oral     SpO2 02/29/16 1145 100 %     Weight --      Height --      Pain Score 02/29/16 1146 7   Updated Vital Signs BP 129/69 (BP Location: Left Arm)   Pulse 60   Temp 98.5 F (36.9 C) (Oral)   Resp 16   SpO2 100%  Physical Exam  Constitutional: He is oriented to person, place, and time. No distress.  Alert, nicely groomed  HENT:  Head: Atraumatic.  Mouth/Throat:    Carious molar, absent teeth, a few teeth broken off at gumline  Eyes:  Conjugate gaze, no eye redness/drainage  Neck: Neck supple.  Cardiovascular: Normal rate.  Pulmonary/Chest: No respiratory distress.  Abdominal: He exhibits no distension.  Musculoskeletal: Normal range of motion.  Neurological: He is alert and oriented to person, place, and time.  Skin: Skin is warm and dry.  No cyanosis  Nursing note and vitals reviewed.    UC Treatments / Results   Procedures Procedures (including critical care time)      none  Final Clinical Impressions(s) / UC Diagnoses   Final diagnoses:  Pain, dental   Tooth pain seems most likely to be due to a dental infection.  Prescription for amoxicillin/clavulanate (an antibiotic) [facility] on [address].  Ibuprofen or aleve will help with discomfort.  Followup with a dentist if symptoms persist.  New Prescriptions New Prescriptions   AMOXICILLIN-CLAVULANATE (AUGMENTIN) 875-125 MG TABLET    Take 1 tablet by mouth every 12 (twelve) hours.     Eustace MooreLaura W Albaro Deviney, MD 03/09/16 [phone]

## 2016-02-29 NOTE — Discharge Instructions (Addendum)
Tooth pain seems most likely to be due to a dental infection.  Prescription for [facility]ulanate (an antibiotic) [facility] on [address].  Ibuprofen or aleve will help with discomfort.  Followup with a dentist if symptoms persist.
